# Patient Record
Sex: Male | Born: 1965 | Race: White | Hispanic: No | Marital: Married | Smoking: Current every day smoker
Health system: Southern US, Community
[De-identification: ages and names within clinical notes are randomized; demographics above are authoritative.]

## PROBLEM LIST (undated history)

## (undated) DIAGNOSIS — S022XXA Fracture of nasal bones, initial encounter for closed fracture: Secondary | ICD-10-CM

## (undated) DIAGNOSIS — S0285XA Fracture of orbit, unspecified, initial encounter for closed fracture: Secondary | ICD-10-CM

## (undated) DIAGNOSIS — S02609A Fracture of mandible, unspecified, initial encounter for closed fracture: Secondary | ICD-10-CM

## (undated) DIAGNOSIS — M549 Dorsalgia, unspecified: Secondary | ICD-10-CM

## (undated) DIAGNOSIS — M1990 Unspecified osteoarthritis, unspecified site: Secondary | ICD-10-CM

## (undated) DIAGNOSIS — K219 Gastro-esophageal reflux disease without esophagitis: Secondary | ICD-10-CM

## (undated) DIAGNOSIS — E785 Hyperlipidemia, unspecified: Secondary | ICD-10-CM

## (undated) HISTORY — PX: NO PAST SURGERIES: SHX2092

---
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
## 2008-02-23 ENCOUNTER — Encounter: Payer: Self-pay | Admitting: Cardiology

## 2008-08-06 ENCOUNTER — Encounter: Payer: Self-pay | Admitting: Cardiology

## 2008-11-26 ENCOUNTER — Encounter: Admission: RE | Admit: 2008-11-26 | Discharge: 2008-11-26 | Payer: Self-pay | Admitting: Neurosurgery

## 2008-12-31 ENCOUNTER — Encounter: Payer: Self-pay | Admitting: Cardiology

## 2009-01-14 ENCOUNTER — Encounter: Payer: Self-pay | Admitting: Cardiology

## 2009-02-13 ENCOUNTER — Encounter: Payer: Self-pay | Admitting: Cardiology

## 2009-07-29 ENCOUNTER — Encounter: Payer: Self-pay | Admitting: Cardiology

## 2009-11-08 ENCOUNTER — Encounter: Payer: Self-pay | Admitting: Cardiology

## 2009-11-25 ENCOUNTER — Encounter: Payer: Self-pay | Admitting: Cardiology

## 2009-12-02 ENCOUNTER — Ambulatory Visit: Payer: Self-pay | Admitting: Cardiology

## 2009-12-02 DIAGNOSIS — R072 Precordial pain: Secondary | ICD-10-CM | POA: Insufficient documentation

## 2009-12-02 DIAGNOSIS — F17200 Nicotine dependence, unspecified, uncomplicated: Secondary | ICD-10-CM

## 2009-12-02 DIAGNOSIS — E785 Hyperlipidemia, unspecified: Secondary | ICD-10-CM

## 2009-12-04 ENCOUNTER — Telehealth: Payer: Self-pay | Admitting: Cardiology

## 2009-12-16 ENCOUNTER — Telehealth: Payer: Self-pay | Admitting: Cardiology

## 2009-12-25 ENCOUNTER — Ambulatory Visit: Payer: Self-pay | Admitting: Cardiology

## 2009-12-26 ENCOUNTER — Ambulatory Visit (HOSPITAL_COMMUNITY): Admission: RE | Admit: 2009-12-26 | Discharge: 2009-12-26 | Payer: Self-pay | Admitting: Cardiology

## 2009-12-27 ENCOUNTER — Telehealth: Payer: Self-pay | Admitting: Cardiology

## 2010-01-28 ENCOUNTER — Encounter: Payer: Self-pay | Admitting: Cardiology

## 2010-02-25 ENCOUNTER — Encounter (INDEPENDENT_AMBULATORY_CARE_PROVIDER_SITE_OTHER): Payer: Self-pay | Admitting: *Deleted

## 2010-06-29 ENCOUNTER — Encounter: Payer: Self-pay | Admitting: Cardiology

## 2010-07-08 NOTE — Miscellaneous (Signed)
  Clinical Lists Changes  Observations: Added new observation of CT SCAN: There are no pleural or pericardial effusions identified. No enlarged lymph nodes are present. Minimal dependent atelectasis identified.  The visualized lungs are otherwise clear without evidence of focal airspace disease, consolidation, or pulmonary nodules/masses. No endobronchial lesions are noted. No acute or suspicious bony abnormalities are identified.   Mild fatty infiltration of the liver is identified.   IMPRESSION: Mild fatty infiltration of the liver. (12/26/2009 9:19)      CT Scan  Procedure date:  12/26/2009  Findings:      There are no pleural or pericardial effusions identified. No enlarged lymph nodes are present. Minimal dependent atelectasis identified.  The visualized lungs are otherwise clear without evidence of focal airspace disease, consolidation, or pulmonary nodules/masses. No endobronchial lesions are noted. No acute or suspicious bony abnormalities are identified.   Mild fatty infiltration of the liver is identified.   IMPRESSION: Mild fatty infiltration of the liver.

## 2010-07-08 NOTE — Progress Notes (Signed)
Summary: wants to set up heart scan  Phone Note Call from Patient   Caller: Patient [phone] Reason for Call: Talk to Nurse Summary of Call: wife janet calling re setting up a heart scan-pt complaining of some chest pain and they want the test done asap-their insurance will cover one scan-so they are hoping that's all he needs-pls call [phone] Initial call taken by: Glynda Jaeger,  December 04, 2009 10:08 AM  Follow-up for Phone Call        Spoke with pt's wife regarding cardiac CT. to be scheduled. Wife states the insurance will pay for one scan. Pt's wife call was transfered [place] to  set up the appointment.  Follow-up by: Ollen Gross, RN, BSN,  December 04, 2009 11:16 AM

## 2010-07-08 NOTE — Letter (Signed)
Summary: [facility]  [facility]   Imported By: Marylou Mccoy 03/19/2010 14:17:25  _____________________________________________________________________  External Attachment:    Type:   Image     Comment:   External Document

## 2010-07-08 NOTE — Progress Notes (Signed)
Summary: cp  Phone Note Call from Patient Call back at Home Phone [phone]   Caller: Spouse Reason for Call: Talk to Nurse Details for Reason: cp Summary of Call: Pt called me @ home stating he was having  cp. Initial call taken by: Omar Person,  December 04, 2009 11:17 AM  Follow-up for Phone Call        Spoke with pt. Patient states he has been having  a slight pressure on and off for some time on his chest it lasts 30 to 45 minutes. today when he has the pressure he ate two peanut butter crakers and the pain was relief instantly. Pt. would like to know if that was a coincidense?. I let him know it could be ,but I am not sure. Pt. states he feels fine now. Pt denies any  other symptoms. Pt also was calling regarding the cardiac CT scan. I let py. know the test has been scheduled. His wifer is aware. Okay with pt. Follow-up by: Ollen Gross, RN, BSN,  December 04, 2009 11:35 AM

## 2010-07-08 NOTE — Assessment & Plan Note (Signed)
Summary: np6/ dx: chestpain. normal ekg, + risk factors/ pt has [place] gd   Visit Type:  Initial Consult Primary Provider:  Paulita Cradle  CC:  chest pain.  History of Present Illness: The patient presents for evaluation of chest discomfort. He has no prior cardiac history but significant cardiovascular risk factors. He reports chest discomfort for about 4-5 months. This has been waking him up at night. It is substernal. It is a heaviness. It is moderate in intensity. It lasts for several minutes perhaps up to 30. It comes and goes spontaneously. He has noticed this rarely during the day but cannot bring it on with activity. He does yard work and some lifting without bringing this on. This discomfort is not like his previous reflux. There is no radiation. There are no associated symptoms. Recently he started Xanax and has noticed less of this discomfort at night. He did have an exercise treadmill test in September. I reviewed this. There were no abnormalities suggestive of high-grade obstructive coronary disease at that time.  Current Medications (verified): 1)  Fenofibrate 160 Mg Tabs (Fenofibrate) .Marland Kitchen.. 1 Tab Once Daily 2)  Hydrocodone-Acetaminophen 10-325 Mg Tabs (Hydrocodone-Acetaminophen) .... 2 Tabs Qam..1 Tab Lunch Time..2 Tabs At Bedtime. 3)  Soma 350 Mg Tabs (Carisoprodol) .Marland Kitchen.. 1 Tab Three Times A Day 4)  Alprazolam 1 Mg Tabs (Alprazolam) .... 1/2 Tab Two Times A Day At Bedtime  Allergies (verified): No Known Drug Allergies  Past History:  Past Medical History: Dyslipidemia Tobacco abuse  Family History: Father died of a myocardial infarction at age 45  Social History: The patient is a Production designer, theatre/television/film for [employer] He has 2 sons He is married He has smoked one pack per day all of his adult life  Review of Systems       Positive for back pain, reflux. Otherwise as stated in the history of present illness negative for all other systems.  Vital Signs:  Patient profile:   45 year  old male Height:      73 inches Weight:      229 pounds BMI:     30.32 Pulse rate:   68 / minute Pulse rhythm:   regular BP sitting:   123 / 84  (left arm) Cuff size:   large  Vitals Entered By: Danielle Rankin, CMA (December 02, 2009 1:21 PM)  Nutrition Counseling: Patient's BMI is greater than 25 and therefore counseled on weight management options.  Physical Exam  General:  Well developed, well nourished, in no acute distress. Head:  normocephalic and atraumatic Eyes:  PERRLA/EOM intact; conjunctiva and lids normal. Mouth:  Teeth, gums and palate normal. Oral mucosa normal. Neck:  Neck supple, no JVD. No masses, thyromegaly or abnormal cervical nodes. Chest Wall:  no deformities or breast masses noted Lungs:  Clear bilaterally to auscultation and percussion. Heart:  Non-displaced PMI, chest non-tender; regular rate and rhythm, S1, S2 without murmurs, rubs or gallops. Carotid upstroke normal, no bruit. Normal abdominal aortic size, no bruits. Femorals normal pulses, no bruits. Pedals normal pulses. No edema, no varicosities. Abdomen:  Bowel sounds positive; abdomen soft and non-tender without masses, organomegaly, or hernias noted. No hepatosplenomegaly. Msk:  Back normal, normal gait. Muscle strength and tone normal. Pulses:  pulses normal in all 4 extremities Extremities:  No clubbing or cyanosis. Neurologic:  Alert and oriented x 3. Skin:  Intact without lesions or rashes. Cervical Nodes:  no significant adenopathy Inguinal Nodes:  no significant adenopathy Psych:  Normal affect.   EKG  Procedure date:  12/02/2009  Findings:      Sinus rhythm, rate 68, axis within normal limits, intervals within normal limits, no acute ST-T wave changes.  Impression & Recommendations:  Problem # 1:  PRECORDIAL PAIN (ICD-786.51) The patient has very significant cardiovascular risk factors. I think the possibility of obstructive coronary disease is high. Therefore, imaging is indicated. I think  the best test in this situation would be coronary CT angiography. Orders: Cardiac CTA (Cardiac CTA)  Problem # 2:  TOBACCO ABUSE (ICD-305.1) I spent greater than 10 minutes discussing the need to stop smoking. I offered him the drug Chantix. He does not want to try this now but will consider quitting on his own terms.  Problem # 3:  DYSLIPIDEMIA (ICD-272.4) He has very elevated triglycerides. This is being followed by his primary care doctor. He is to be on Niaspan and fenofibrate but is not affording these medications. I will defer to his primary provider. We discussed at length dietary changes.  Patient Instructions: 1)  Your physician has recommended you make the following change in your medication:  2)  Start Omeprazole 40mg  daily 3)  Your physician has requested that you have a cardiac CT.  Cardiac computed tomography (CT) is a painless test that uses an x-ray machine to take clear, detailed pictures of your heart.  For further information please visit [email or web address]  Please follow instruction sheet as given. 4)  Your physician recommends that you schedule a follow-up appointment in: 2 months with Dr  Antoine Poche. Prescriptions: OMEPRAZOLE 40 MG CPDR (OMEPRAZOLE) one daily  #90 x 0   Entered by:   Katina Dung, RN, BSN   Authorized by:   Rollene Rotunda, MD, [facility]   Signed by:   Katina Dung, RN, BSN on 12/02/2009   Method used:   Electronically to        [facility] [phone]* (retail)       [address]       [place], [place]  [address]       Ph: [phone] or [phone]       Fax: [phone]   RxID:   [id number]   I have reviewed and approved all prescriptions at the time of this visit. Rollene Rotunda, MD, [facility]  December 02, 2009 2:20 PM

## 2010-07-08 NOTE — Progress Notes (Signed)
Summary: Cardiac CT  Phone Note Call from Patient Call back at Home Phone [phone]   Caller: Spouse Reason for Call: Talk to Nurse Summary of Call: pt forgot about Cardiac CT appt would like to have it rs.  Initial call taken by: Edman Circle,  December 16, 2009 2:32 PM  Follow-up for Phone Call        12/16/09--1440pm--pt's wife calling stating pt forgot about scheduled  cardiac CT--advised would have to reschedule--pt's wife states they do want to reschedule--information given to [place] for rescheduling--nt Follow-up by: Ledon Snare, RN,  December 16, 2009 2:59 PM

## 2010-07-08 NOTE — Letter (Signed)
Summary: Appointment - Missed  [facility], Main Office  [address]   [place], [place] [address]   Phone: [phone]  Fax: [phone]     February 25, 2010 MRN: [id number]   Timothy Villa [address] [place], [place]  [address]   Dear Mr. Shealy,  Our records indicate you missed your appointment on 01/29/2010 at 04:30pm with Dr. Antoine Poche. It is very important that we reach you to reschedule this appointment. We look forward to participating in your health care needs. Please contact us at the number listed above at your earliest convenience to reschedule this appointment.     Sincerely,  Neurosurgeon Team  GD

## 2010-07-08 NOTE — Progress Notes (Signed)
  Phone Note Other Incoming   Caller: PT. Summary of Call: PT CALLED CONT TO C/O CHEST HEAVINESS  BELIEVES  STRESS RELATED  WAS WANTING DR Haiden Clucas TO PERSCRIBE SOMETHING FOR STRESS PER PT GEN XANAX NOT WORKING INSTRUCTED PT TO CALL PMD FOR DIFFER MED  Initial call taken by: Scherrie Bateman, LPN,  December 27, 2009 4:13 PM

## 2012-12-06 ENCOUNTER — Encounter (HOSPITAL_COMMUNITY): Payer: Self-pay | Admitting: General Practice

## 2012-12-06 ENCOUNTER — Observation Stay (HOSPITAL_COMMUNITY)
Admission: AD | Admit: 2012-12-06 | Discharge: 2012-12-07 | Disposition: A | Discharge status: Home / Self Care | Payer: BC Managed Care – PPO | Source: Other Acute Inpatient Hospital | Attending: General Surgery | Admitting: General Surgery

## 2012-12-06 DIAGNOSIS — W208XXA Other cause of strike by thrown, projected or falling object, initial encounter: Secondary | ICD-10-CM | POA: Insufficient documentation

## 2012-12-06 DIAGNOSIS — S1093XA Contusion of unspecified part of neck, initial encounter: Secondary | ICD-10-CM | POA: Insufficient documentation

## 2012-12-06 DIAGNOSIS — S02401A Maxillary fracture, unspecified, initial encounter for closed fracture: Secondary | ICD-10-CM

## 2012-12-06 DIAGNOSIS — S02109A Fracture of base of skull, unspecified side, initial encounter for closed fracture: Secondary | ICD-10-CM

## 2012-12-06 DIAGNOSIS — S0003XA Contusion of scalp, initial encounter: Secondary | ICD-10-CM | POA: Insufficient documentation

## 2012-12-06 DIAGNOSIS — S022XXA Fracture of nasal bones, initial encounter for closed fracture: Secondary | ICD-10-CM

## 2012-12-06 DIAGNOSIS — R200 Anesthesia of skin: Secondary | ICD-10-CM | POA: Diagnosis present

## 2012-12-06 DIAGNOSIS — S0285XA Fracture of orbit, unspecified, initial encounter for closed fracture: Secondary | ICD-10-CM

## 2012-12-06 DIAGNOSIS — S02609A Fracture of mandible, unspecified, initial encounter for closed fracture: Secondary | ICD-10-CM

## 2012-12-06 DIAGNOSIS — Y93H9 Activity, other involving exterior property and land maintenance, building and construction: Secondary | ICD-10-CM | POA: Insufficient documentation

## 2012-12-06 DIAGNOSIS — G5690 Unspecified mononeuropathy of unspecified upper limb: Secondary | ICD-10-CM | POA: Insufficient documentation

## 2012-12-06 DIAGNOSIS — R209 Unspecified disturbances of skin sensation: Secondary | ICD-10-CM

## 2012-12-06 DIAGNOSIS — S0993XA Unspecified injury of face, initial encounter: Secondary | ICD-10-CM

## 2012-12-06 DIAGNOSIS — S0230XA Fracture of orbital floor, unspecified side, initial encounter for closed fracture: Secondary | ICD-10-CM | POA: Insufficient documentation

## 2012-12-06 DIAGNOSIS — S0280XA Fracture of other specified skull and facial bones, unspecified side, initial encounter for closed fracture: Secondary | ICD-10-CM

## 2012-12-06 HISTORY — DX: Fracture of mandible, unspecified, initial encounter for closed fracture: S02.609A

## 2012-12-06 HISTORY — DX: Fracture of nasal bones, initial encounter for closed fracture: S02.2XXA

## 2012-12-06 HISTORY — DX: Gastro-esophageal reflux disease without esophagitis: K21.9

## 2012-12-06 HISTORY — DX: Hyperlipidemia, unspecified: E78.5

## 2012-12-06 HISTORY — DX: Fracture of orbit, unspecified, initial encounter for closed fracture: S02.85XA

## 2012-12-06 HISTORY — DX: Unspecified osteoarthritis, unspecified site: M19.90

## 2012-12-06 HISTORY — DX: Dorsalgia, unspecified: M54.9

## 2012-12-06 MED ORDER — SODIUM CHLORIDE 0.9 % IV SOLN: 250.0000 mL | INTRAVENOUS | Status: DC | PRN

## 2012-12-06 MED ORDER — ENOXAPARIN SODIUM 30 MG/0.3ML ~~LOC~~ SOLN
30.0000 mg | Freq: Two times a day (BID) | SUBCUTANEOUS | Status: DC
  Administered 2012-12-06: 30 mg via SUBCUTANEOUS
  Filled 2012-12-06 (×4): qty 0.3

## 2012-12-06 MED ORDER — PANTOPRAZOLE SODIUM 40 MG PO TBEC
40.0000 mg | DELAYED_RELEASE_TABLET | Freq: Every day | ORAL | Status: DC
  Administered 2012-12-06: 40 mg via ORAL
  Filled 2012-12-06: qty 1

## 2012-12-06 MED ORDER — DOCUSATE SODIUM 100 MG PO CAPS
100.0000 mg | ORAL_CAPSULE | Freq: Two times a day (BID) | ORAL | Status: DC
  Administered 2012-12-07: 100 mg via ORAL
  Filled 2012-12-06 (×2): qty 1

## 2012-12-06 MED ORDER — NICOTINE 21 MG/24HR TD PT24
21.0000 mg | MEDICATED_PATCH | Freq: Every day | TRANSDERMAL | Status: DC
  Administered 2012-12-06 – 2012-12-07 (×2): 21 mg via TRANSDERMAL
  Filled 2012-12-06 (×2): qty 1

## 2012-12-06 MED ORDER — ONDANSETRON HCL 4 MG PO TABS: 4.0000 mg | ORAL_TABLET | Freq: Four times a day (QID) | ORAL | Status: DC | PRN

## 2012-12-06 MED ORDER — OXYCODONE HCL 5 MG PO TABS
10.0000 mg | ORAL_TABLET | ORAL | Status: DC | PRN
  Administered 2012-12-06 – 2012-12-07 (×6): 20 mg via ORAL
  Filled 2012-12-06 (×7): qty 4

## 2012-12-06 MED ORDER — OXYMETAZOLINE HCL 0.05 % NA SOLN
2.0000 | Freq: Four times a day (QID) | NASAL | Status: DC
  Administered 2012-12-06: 2 via NASAL
  Filled 2012-12-06 (×2): qty 15

## 2012-12-06 MED ORDER — BACITRACIN ZINC 500 UNIT/GM EX OINT
TOPICAL_OINTMENT | Freq: Two times a day (BID) | CUTANEOUS | Status: DC
  Administered 2012-12-06: 1 via TOPICAL
  Administered 2012-12-07: 11:00:00 via TOPICAL
  Filled 2012-12-06: qty 15

## 2012-12-06 MED ORDER — MORPHINE SULFATE 4 MG/ML IJ SOLN
INTRAMUSCULAR | Status: AC
  Administered 2012-12-06: 4 mg
  Filled 2012-12-06: qty 1

## 2012-12-06 MED ORDER — SODIUM CHLORIDE 0.9 % IJ SOLN: 3.0000 mL | Freq: Two times a day (BID) | INTRAMUSCULAR | Status: DC

## 2012-12-06 MED ORDER — MORPHINE SULFATE 4 MG/ML IJ SOLN
4.0000 mg | INTRAMUSCULAR | Status: DC | PRN
  Administered 2012-12-06 – 2012-12-07 (×4): 4 mg via INTRAVENOUS
  Filled 2012-12-06 (×5): qty 1

## 2012-12-06 MED ORDER — ONDANSETRON HCL 4 MG/2ML IJ SOLN: 4.0000 mg | Freq: Four times a day (QID) | INTRAMUSCULAR | Status: DC | PRN

## 2012-12-06 MED ORDER — PANTOPRAZOLE SODIUM 40 MG IV SOLR
40.0000 mg | Freq: Every day | INTRAVENOUS | Status: DC
  Filled 2012-12-06: qty 40

## 2012-12-06 MED ORDER — SODIUM CHLORIDE 0.9 % IJ SOLN: 3.0000 mL | INTRAMUSCULAR | Status: DC | PRN

## 2012-12-06 NOTE — Consult Note (Signed)
Reason for Consult:l facial trauma  Referring Physician: Trauma  Timothy Villa is an 47 y.o. male.  WUJ:WJXBJ force trauma to the left face while cutting down a tree,No LOC. Seen at ED at [facility] [place]. With facial fractures and referred to [facility]. Pt denies loss of vision. Denies diplopia.  Past Medical History  Diagnosis Date  . Fracture, orbital 12/06/2012    LEFT EYE  . Nasal fracture 12/06/2012  . Jaw fracture 12/06/2012  . GERD (gastroesophageal reflux disease)   . Back pain   . Hyperlipidemia   . Arthritis     History reviewed. No pertinent past surgical history.  History reviewed. No pertinent family history.  Social History:  reports that he has been smoking Cigarettes.  He has a 30 pack-year smoking history. He has never used smokeless tobacco. He reports that  drinks alcohol. He reports that he does not use illicit drugs.  Allergies: No Known Allergies  Medications: I have reviewed the patient's current medications.  No results found for this or any previous visit (from the past 48 hour(s)).  No results found.  Review of Systems  Constitutional: Negative.   HENT: Negative for hearing loss, ear pain, nosebleeds, congestion, sore throat, tinnitus and ear discharge.   Eyes: Negative for blurred vision, double vision, photophobia, pain, discharge and redness.  Respiratory: Negative.  Negative for stridor.   Cardiovascular: Negative.   Gastrointestinal: Negative.   Genitourinary: Negative.   Musculoskeletal: Negative.   Skin: Negative.   Neurological: Positive for headaches.   Blood pressure 132/81, pulse 72, temperature 98.5 F (36.9 C), temperature source Axillary, resp. rate 16, SpO2 97.00%. Physical Exam  Constitutional: He appears well-developed and well-nourished.  HENT:  Head: Normocephalic. Head is with abrasion, with contusion and with left periorbital erythema.    Eyes: EOM are normal. Pupils are equal, round, and reactive to light.  Fundoscopic  exam:      The right eye shows no hemorrhage. The right eye shows red reflex.       The left eye shows no hemorrhage. The left eye shows red reflex.  Slit lamp exam:      The right eye shows no corneal abrasion, no corneal flare, no corneal ulcer, no foreign body, no hyphema and no hypopyon.       The left eye shows no corneal abrasion, no corneal flare, no corneal ulcer, no foreign body, no hyphema and no hypopyon.    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 Atraumatic left globe status post blunt force trauma to face resulting in facial fractures of the orbital floor and nose. There is no evidence of golobe or optic nerve trauma or impingement.  The patient is clear for facial reconstruction per plastics.  Additional eye care is advised as an outpatient 1-2 weeks s/p fracture repair.  Timothy Villa B 12/06/2012, 6:29 PM

## 2012-12-06 NOTE — Consult Note (Signed)
Reason for Consult:Facial fracture Referring Physician: Dr. Burke Thompson  Timothy Villa is an 46 y.o. male.  HPI: The patient is a 46 yrs old wm here with his wife for evaluation of facial fractures and right hand ulnar sided numbness.  He was cutting down a tree when a portion of it broke and he was struck in the face.  He was taken to the [facility] ED.  CT shows fracture of the left orbital floor, inferior rim, medial wall, maxillary anterior wall and bilateral displaced nasal fractures.  Clinically, he does not have a hematoma of the septum but he is bleeding from his nose.  He has significant swelling of the left periorbital area. EOMI bilaterally and no double vision.  Bone step offs of the left, no malocclusion or jaw pain.   Past Medical History  Diagnosis Date  . Fracture, orbital 12/06/2012    LEFT EYE  . Nasal fracture 12/06/2012  . Jaw fracture 12/06/2012  . GERD (gastroesophageal reflux disease)   . Back pain   . Hyperlipidemia   . Arthritis     History reviewed. No pertinent past surgical history.  History reviewed. No pertinent family history.  Social History:  reports that he has been smoking Cigarettes.  He has a 30 pack-year smoking history. He has never used smokeless tobacco. He reports that  drinks alcohol. He reports that he does not use illicit drugs.  Allergies: No Known Allergies  Medications: I have reviewed the patient's current medications.  No results found for this or any previous visit (from the past 48 hour(s)).  No results found.  Review of Systems  Constitutional: Negative.   HENT: Positive for nosebleeds.   Eyes: Positive for pain, discharge and redness. Negative for blurred vision and double vision.  Respiratory: Negative.   Cardiovascular: Negative.  Negative for chest pain.  Gastrointestinal: Negative.  Negative for nausea and vomiting.  Genitourinary: Negative.   Musculoskeletal: Negative.   Skin: Negative.   Neurological: Positive for  tingling and headaches. Negative for speech change and seizures.  Psychiatric/Behavioral: Negative.    Blood pressure 132/81, pulse 72, temperature 98.5 F (36.9 C), temperature source Axillary, resp. rate 16, SpO2 97.00%. Physical Exam  Constitutional: He appears well-developed and well-nourished.  HENT:  Head: Normocephalic.    Right Ear: External ear normal.  Left Ear: External ear normal.  Eyes: EOM are normal. Pupils are equal, round, and reactive to light. Scleral icterus is present.  Cardiovascular: Normal rate.   Respiratory: Effort normal.  Neurological: He is alert.  Skin: Skin is warm.  Psychiatric: He has a normal mood and affect. His behavior is normal. Judgment and thought content normal.    Assessment/Plan: Bilateral nasal fractures, left orbital fracture.  HOB elevated 45 degrees, no nasal blowing, afrin spray to nose. Ice on face as tolerated.  Follow up in clinic.  Will need surgery for nose and orbit.  SANGER,CLAIRE 12/06/2012, 6:17 PM      

## 2012-12-06 NOTE — H&P (Signed)
Timothy Villa is an 47 y.o. male.   Chief Complaint: facialpain, right third, fourth, and fifth finger numbness HPI: Patient was working at cutting down a tree when the tree split. A large portion of the trunk him in the left side of his face. No loss of consciousness. He was evaluated at the emergency department at [facility] in [place]. He was found to have multiple facial fractures. He also complained of numbness in his right third fourth and fifth finger. Emergency department physician there contacted Dr. Kelly Splinter from plastic surgery regarding his facial fractures. He was concerned regarding the finger numbness and he asked [place] to admit the patient to our trauma service. I accepted him in transfer.On arrival he complains of headache and finger numbness as above.  Past medical history: Arthritis, back pain, reflux  Past surgical history: Denies  No family history on file. Social History:  has no tobacco, alcohol, and drug history on file.  Allergies: None  Meds: Nexium, hydrocodone  No results found for this or any previous visit (from the past 48 hour(s)). No results found.  Review of Systems  Constitutional: Negative.   HENT: Positive for ear pain.        Ear discomfort since swimming yesterday  Eyes: Positive for pain. Negative for blurred vision and double vision.  Respiratory: Negative.   Cardiovascular: Negative.   Gastrointestinal: Negative.   Genitourinary: Negative.   Musculoskeletal: Negative.   Skin: Negative.   Neurological: Positive for tingling and headaches.  Endo/Heme/Allergies: Negative.   Psychiatric/Behavioral: Negative.     Heart rate 68, respirations 16, blood pressure and temperature pending Physical Exam  Constitutional: He appears well-developed and well-nourished. No distress.  HENT:  Head: Head is with contusion, with laceration and with left periorbital erythema.    Right Ear: No drainage. Tympanic membrane is not injected. No hemotympanum.  Left Ear:  No drainage. Tympanic membrane is not injected. No hemotympanum.  Nose: Sinus tenderness and nasal deformity present. Epistaxis is observed.  Significant edema and tenderness bridge of nose, left periorbital ecchymosis and small abrasion type lacerations to the upper lid and just lateral to the eye, facial tissue is packed into each nare  Eyes: EOM are normal. Pupils are equal, round, and reactive to light. No scleral icterus.  Neck: Normal range of motion. No tracheal deviation present.  No posterior midline tenderness, no pain on active range of motion  Cardiovascular: Normal rate, regular rhythm, normal heart sounds and intact distal pulses.   Respiratory: Effort normal. No stridor. No respiratory distress. He has no wheezes. He has no rales. He exhibits no tenderness.  GI: Soft. Bowel sounds are normal. He exhibits no distension and no mass. There is no tenderness. There is no rebound and no guarding.  Musculoskeletal: Normal range of motion. He exhibits no edema and no tenderness.  Neurological: He is alert. He displays no atrophy and no tremor. He exhibits normal muscle tone. He displays no seizure activity. GCS eye subscore is 4. GCS verbal subscore is 5. GCS motor subscore is 6.  Strength equal x4 extremities Decreased light touch sensation right third, fourth, and fifth finger  Skin: Skin is warm.  Tattoo left arm     Assessment/Plan Status post blunt facial trauma from a tree packed. Facial fractures including nasal fracture, left orbital floor fracture, left maxillary sinus fracture and facial contusions and abrasions, numbness of right third, fourth, and fifth finger. Admit to trauma. Dr. Kelly Splinter will see him in consultation from plastic surgery for  the facial injuries. We will also ask Dr. Allyne Gee from ophthalmology to see him regarding this injury. We do not have a good explanation for the right finger numbness. We will check an MR of the cervical spine to rule out an acute disc  injury. Plan was discussed in detail with the patient and his wife.  Jerriah Ines E 12/06/2012, 4:02 PM

## 2012-12-07 ENCOUNTER — Observation Stay (HOSPITAL_COMMUNITY): Payer: BC Managed Care – PPO

## 2012-12-07 ENCOUNTER — Encounter (HOSPITAL_COMMUNITY): Payer: Self-pay

## 2012-12-07 DIAGNOSIS — G5690 Unspecified mononeuropathy of unspecified upper limb: Secondary | ICD-10-CM

## 2012-12-07 DIAGNOSIS — S0993XA Unspecified injury of face, initial encounter: Secondary | ICD-10-CM

## 2012-12-07 MED ORDER — IOHEXOL 350 MG/ML SOLN
50.0000 mL | Freq: Once | INTRAVENOUS | Status: AC | PRN
  Administered 2012-12-07: 50 mL via INTRAVENOUS

## 2012-12-07 MED ORDER — NAPROXEN 500 MG PO TABS: 500.0000 mg | ORAL_TABLET | Freq: Two times a day (BID) | ORAL | Status: AC

## 2012-12-07 MED ORDER — NAPROXEN 500 MG PO TABS
500.0000 mg | ORAL_TABLET | Freq: Two times a day (BID) | ORAL | Status: DC
  Filled 2012-12-07 (×2): qty 1

## 2012-12-07 MED ORDER — OXYMETAZOLINE HCL 0.05 % NA SOLN: 2.0000 | Freq: Two times a day (BID) | NASAL | Status: AC

## 2012-12-07 MED ORDER — TRAMADOL HCL 50 MG PO TABS
100.0000 mg | ORAL_TABLET | Freq: Four times a day (QID) | ORAL | Status: DC
  Administered 2012-12-07: 100 mg via ORAL
  Filled 2012-12-07: qty 2

## 2012-12-07 MED ORDER — OXYCODONE-ACETAMINOPHEN 10-325 MG PO TABS: 1.0000 | ORAL_TABLET | ORAL | Status: AC | PRN

## 2012-12-07 MED ORDER — TRAMADOL HCL 50 MG PO TABS: 100.0000 mg | ORAL_TABLET | Freq: Four times a day (QID) | ORAL | Status: AC

## 2012-12-07 MED ORDER — CARISOPRODOL 350 MG PO TABS
350.0000 mg | ORAL_TABLET | Freq: Four times a day (QID) | ORAL | Status: DC
  Administered 2012-12-07 (×2): 350 mg via ORAL
  Filled 2012-12-07 (×2): qty 1

## 2012-12-07 NOTE — Progress Notes (Signed)
Patient ID: Timothy Villa, male   DOB: May 10, 1966, 47 y.o.   MRN: [id number]   LOS: 1 day   Subjective: C/o severe pain in face and neck, oxycodone not strong enough. Thinks numbness in right fingers worse, had some brief pain last night.   Objective: Vital signs in last 24 hours: Temp:  [98.1 F (36.7 C)-98.8 F (37.1 C)] 98.8 F (37.1 C) (07/02 0534) Pulse Rate:  [57-72] 60 (07/02 0534) Resp:  [16-19] 17 (07/02 0534) BP: (107-138)/(63-81) 117/71 mmHg (07/02 0534) SpO2:  [97 %-99 %] 98 % (07/02 0534) Last BM Date: 12/06/12   Physical Exam General appearance: alert and no distress Back: TTP right trap w/spasm Resp: clear to auscultation bilaterally Cardio: regular rate and rhythm GI: normal findings: bowel sounds normal and soft, non-tender Extremities: Warm and dry   Assessment/Plan: Blunt facial trauma Multiple facial fxs/lacs -- Dr. Kelly Splinter plans delayed ORIF Right hand neuropathy -- Awaiting MRI FEN -- Pain not well controlled as still needing breakthrough IV meds, will add tramadol, NSAID, MR VTE -- SCD's, Lovenox Dispo -- Home once pain controlled if MRI negative   Freeman Caldron, PA-C Pager: [phone] General Trauma PA Pager: [phone]   12/07/2012

## 2012-12-07 NOTE — Progress Notes (Signed)
Patient is ambulating in the hallways.  May be able to go home later this afternoon.  This patient has been seen and I agree with the findings and treatment plan.  Marta Lamas. Gae Bon, MD, FACS [phone] (pager) [phone] (direct pager) Trauma Surgeon

## 2012-12-07 NOTE — Progress Notes (Signed)
UR completed 

## 2012-12-07 NOTE — Progress Notes (Signed)
Discharge instructions reviewed with patient, including new prescriptions. Printed AVS and prescription given to patient. Patient discharged to home. Accompanied by family.

## 2012-12-07 NOTE — Progress Notes (Signed)
Pt c/o increased bleeding from left nostril. Blood is dark and thick and is a slow continuous dripping. Had a dose of Afrin nasal spray about 1915 and pt stated it didn't seem to help. MD on call, Dwain Sarna called for further advice. [place] recommended I call Dr. Kelly Splinter who had seen him previously. I called Dr. Kelly Splinter who suggested Vaseline Gauze to be packed into the left nare and a drip pad put in place and taped. Patient made aware and agreed. Vaseline gauze packed into left nare. Pt refused drip pad. Will continue to monitor.

## 2012-12-08 NOTE — Clinical Social Work Note (Signed)
Clinical Social Work Department BRIEF PSYCHOSOCIAL ASSESSMENT 12/07/2012  Patient:  BREYLON, SHERROW     Account Number:  [id number]     Admit date:  12/06/2012  Clinical Social Worker:  Verl Blalock  Date/Time:  12/07/2012 04:00 PM  Referred by:  Physician  Date Referred:  12/07/2012 Referred for  Psychosocial assessment   Other Referral:   Interview type:  Patient Other interview type:   Patient mother and wife at bedside    PSYCHOSOCIAL DATA Living Status:  WIFE Admitted from facility:   Level of care:   Primary support name:  Benak,Janet  [phone] Primary support relationship to patient:  SPOUSE Degree of support available:   Strong    CURRENT CONCERNS Current Concerns  None Noted   Other Concerns:    SOCIAL WORK ASSESSMENT / PLAN Clinical Social Worker met with patient and family at bedside to offer support and discuss patient needs at discharge.  Patient states that he was cutting down a tree in his yard with a logging company when the tree split and came back hitting him in the face.  Patient never lost conciousness and feels very lucky knowing the size of the tree.  Patient wife brought him to [facility] but due to severe facial fractures, he was transferred to [facility]. Patient lives at home with his wife and plans to return home at discharge.  Patient mother has come to stay for several days to assist as well.  Patient is hopeful to discharge this afternoon and has adequate transportation for discharge.    Clinical Social Worker inquired about current substance use.  Patient states that he did a lot of drinking while he was with the [employer], but now he is just a social drinker. Patient has no concerns regarding current use and no substances were involved at the time of the incident. SBIRT complete with no resources needed at this time.  CSW signing off at this time.  No further needs identified. Please reconsult if further needs arise prior to discharge.    Assessment/plan status:  No Further Intervention Required Other assessment/ plan:   Information/referral to community resources:   Patient declined all resources at this time.    PATIENT'S/FAMILY'S RESPONSE TO PLAN OF CARE: Patient alert and oriented x3 sitting up in bed.  Patient is very anxious to get back home.  Patient with great family support who are able to assist when patient gets back home.  Patient and family verbalized their appreciation for CSW support and concern.

## 2012-12-12 ENCOUNTER — Telehealth (HOSPITAL_COMMUNITY): Payer: Self-pay | Admitting: Emergency Medicine

## 2012-12-12 NOTE — Telephone Encounter (Signed)
Faxed note to patient's work clearing him to return if they are ok with his narcotic use. Tried to call patient but no answer and unable to leave message.

## 2012-12-14 NOTE — Discharge Summary (Signed)
Physician Discharge Summary  Patient ID: SANTONIO SPEAKMAN MRN: [id number] DOB/AGE: March 30, 1966 47 y.o.  Admit date: 12/06/2012 Discharge date: 12/07/2012  Discharge Diagnoses Patient Active Problem List   Diagnosis Date Noted  . Blunt trauma of face 12/07/2012  . Left orbit fracture 12/06/2012  . Closed fracture nasal bone 12/06/2012  . Maxillary sinus fracture 12/06/2012  . Numbness of fingers 12/06/2012  . DYSLIPIDEMIA 12/02/2009  . TOBACCO ABUSE 12/02/2009  . PRECORDIAL PAIN 12/02/2009    Consultants Dr. Wayland Denis for plastic surgery  Dr. Stephannie Li for ophthalmology   Procedures None   HPI: Benford was working at cutting down a tree when the tree split. A large portion of the trunk him in the left side of his face. There was no loss of consciousness. He was evaluated at the emergency department at [facility] in [place]. He was found to have multiple facial fractures. He also complained of numbness in his right third fourth and fifth finger. The emergency department physician there contacted Dr. Kelly Splinter from plastic surgery regarding his facial fractures. He was concerned regarding the finger numbness and he asked [place] to admit the patient to our trauma service. I accepted him in transfer.   Hospital Course: The patient was evaluated by plastic surgery who determined he needed delayed fixation of his facial fractures. Ophthalmology cleared him for surgery from their standpoint and advised outpatient followup. His CT scans from [facility] were reviewed. The CT of the cervical spine did not show any fractures but did show significant degenerative changes. Because of the new right-hand neurologic symptoms an MRI was obtained. The showed significant disc disease with some impingement but more importantly appeared to show no flow in the right vertebral artery. 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 At some  point around this time radiology was able to access the patient's previous radiologic studies from [facility] and found that the MRI was unchanged therefore the findings were not acute. His pain was brought under control with oral medications. He was ambulating well and was able to be discharged home in good condition in the care of his wife with recommended close followup with his neurosurgeon Dr. Trey Sailors.      Medication List    STOP taking these medications       HYDROcodone-acetaminophen 10-325 MG per tablet  Commonly known as:  NORCO      TAKE these medications       carisoprodol 350 MG tablet  Commonly known as:  SOMA  Take 350 mg by mouth 3 (three) times daily as needed for muscle spasms.     esomeprazole 40 MG capsule  Commonly known as:  NEXIUM  Take 40 mg by mouth daily before breakfast.     naproxen 500 MG tablet  Commonly known as:  NAPROSYN  Take 1 tablet (500 mg total) by mouth 2 (two) times daily with a meal.     oxyCODONE-acetaminophen 10-325 MG per tablet  Commonly known as:  PERCOCET  Take 1-2 tablets by mouth every 4 (four) hours as needed for pain.     oxymetazoline 0.05 % nasal spray  Commonly known as:  AFRIN  Place 2 sprays into the nose 2 (two) times daily.     simvastatin 40 MG tablet  Commonly known as:  ZOCOR  Take 40 mg by mouth every evening.     traMADol 50 MG tablet  Commonly known as:  ULTRAM  Take 2 tablets (100 mg total) by mouth every 6 (six) hours.             Follow-up Information   Schedule an appointment as soon as possible for a visit with SANGER,CLAIRE, DO.   Contact information:   [address] [place] [address] [phone]       Schedule an appointment as soon as possible for a visit with Trey Sailors, MD.   Contact information:   [address] [place] [address] [phone]       Call [facility]. (As needed)    Contact information:   [address] [place]  [address] [phone]       Signed: Freeman Caldron, PA-C Pager: [phone] General Trauma PA Pager: [phone]  12/14/2012, 3:30 PM

## 2012-12-21 ENCOUNTER — Encounter (HOSPITAL_BASED_OUTPATIENT_CLINIC_OR_DEPARTMENT_OTHER): Payer: Self-pay | Admitting: *Deleted

## 2012-12-21 NOTE — Progress Notes (Signed)
Pt was injured cutting down a tree- Only resp problems from fx nose and face

## 2012-12-22 ENCOUNTER — Ambulatory Visit (HOSPITAL_BASED_OUTPATIENT_CLINIC_OR_DEPARTMENT_OTHER)
Admission: RE | Admit: 2012-12-22 | Discharge: 2012-12-22 | Disposition: A | Discharge status: Home / Self Care | Payer: BC Managed Care – PPO | Source: Ambulatory Visit | Attending: Plastic Surgery | Admitting: Plastic Surgery

## 2012-12-22 ENCOUNTER — Encounter (HOSPITAL_BASED_OUTPATIENT_CLINIC_OR_DEPARTMENT_OTHER): Payer: Self-pay

## 2012-12-22 ENCOUNTER — Encounter (HOSPITAL_BASED_OUTPATIENT_CLINIC_OR_DEPARTMENT_OTHER)
Admission: RE | Disposition: A | Discharge status: Home / Self Care | Payer: Self-pay | Source: Ambulatory Visit | Attending: Plastic Surgery

## 2012-12-22 ENCOUNTER — Encounter (HOSPITAL_BASED_OUTPATIENT_CLINIC_OR_DEPARTMENT_OTHER): Payer: Self-pay | Admitting: Anesthesiology

## 2012-12-22 ENCOUNTER — Ambulatory Visit (HOSPITAL_BASED_OUTPATIENT_CLINIC_OR_DEPARTMENT_OTHER): Payer: BC Managed Care – PPO | Admitting: Anesthesiology

## 2012-12-22 ENCOUNTER — Ambulatory Visit (HOSPITAL_BASED_OUTPATIENT_CLINIC_OR_DEPARTMENT_OTHER): Admit: 2012-12-22 | Payer: Self-pay | Admitting: Plastic Surgery

## 2012-12-22 DIAGNOSIS — E785 Hyperlipidemia, unspecified: Secondary | ICD-10-CM | POA: Insufficient documentation

## 2012-12-22 DIAGNOSIS — S0280XA Fracture of other specified skull and facial bones, unspecified side, initial encounter for closed fracture: Secondary | ICD-10-CM | POA: Insufficient documentation

## 2012-12-22 DIAGNOSIS — M129 Arthropathy, unspecified: Secondary | ICD-10-CM | POA: Insufficient documentation

## 2012-12-22 DIAGNOSIS — S0230XA Fracture of orbital floor, unspecified side, initial encounter for closed fracture: Secondary | ICD-10-CM | POA: Insufficient documentation

## 2012-12-22 DIAGNOSIS — S022XXA Fracture of nasal bones, initial encounter for closed fracture: Secondary | ICD-10-CM | POA: Insufficient documentation

## 2012-12-22 DIAGNOSIS — K219 Gastro-esophageal reflux disease without esophagitis: Secondary | ICD-10-CM | POA: Insufficient documentation

## 2012-12-22 DIAGNOSIS — W208XXA Other cause of strike by thrown, projected or falling object, initial encounter: Secondary | ICD-10-CM | POA: Insufficient documentation

## 2012-12-22 DIAGNOSIS — Y93H9 Activity, other involving exterior property and land maintenance, building and construction: Secondary | ICD-10-CM | POA: Insufficient documentation

## 2012-12-22 HISTORY — PX: CLOSED REDUCTION NASAL FRACTURE: SHX5365

## 2012-12-22 HISTORY — PX: ORIF ORBITAL FRACTURE: SHX5312

## 2012-12-22 LAB — POCT HEMOGLOBIN-HEMACUE: Hemoglobin: 15.6 g/dL (ref 13.0–17.0)

## 2012-12-22 SURGERY — CLOSED REDUCTION, FRACTURE, NASAL BONE
Anesthesia: General | Laterality: Right

## 2012-12-22 SURGERY — CLOSED REDUCTION, FRACTURE, NASAL BONE
Anesthesia: General | Site: Nose | Wound class: Clean Contaminated

## 2012-12-22 MED ORDER — OXYCODONE HCL 5 MG/5ML PO SOLN: 5.0000 mg | Freq: Once | ORAL | Status: AC | PRN

## 2012-12-22 MED ORDER — BSS IO SOLN
INTRAOCULAR | Status: DC | PRN
  Administered 2012-12-22: 5 mL via INTRAOCULAR

## 2012-12-22 MED ORDER — ONDANSETRON HCL 4 MG/2ML IJ SOLN
INTRAMUSCULAR | Status: DC | PRN
  Administered 2012-12-22: 4 mg via INTRAVENOUS

## 2012-12-22 MED ORDER — LIDOCAINE-EPINEPHRINE 1 %-1:100000 IJ SOLN
INTRAMUSCULAR | Status: DC | PRN
  Administered 2012-12-22: 2 mL

## 2012-12-22 MED ORDER — SUCCINYLCHOLINE CHLORIDE 20 MG/ML IJ SOLN
INTRAMUSCULAR | Status: DC | PRN
  Administered 2012-12-22: 100 mg via INTRAVENOUS

## 2012-12-22 MED ORDER — BACITRACIN-NEOMYCIN-POLYMYXIN 400-5-5000 EX OINT
TOPICAL_OINTMENT | CUTANEOUS | Status: DC | PRN
  Administered 2012-12-22: 1 via TOPICAL

## 2012-12-22 MED ORDER — BACITRACIN-POLYMYXIN B 500-10000 UNIT/GM OP OINT
TOPICAL_OINTMENT | OPHTHALMIC | Status: DC | PRN
  Administered 2012-12-22: 1 via OPHTHALMIC

## 2012-12-22 MED ORDER — FENTANYL CITRATE 0.05 MG/ML IJ SOLN
INTRAMUSCULAR | Status: DC | PRN
  Administered 2012-12-22 (×3): 25 ug via INTRAVENOUS
  Administered 2012-12-22: 100 ug via INTRAVENOUS
  Administered 2012-12-22 (×2): 25 ug via INTRAVENOUS

## 2012-12-22 MED ORDER — OXYMETAZOLINE HCL 0.05 % NA SOLN
NASAL | Status: DC | PRN
  Administered 2012-12-22: 1 via NASAL

## 2012-12-22 MED ORDER — HYDROMORPHONE HCL PF 1 MG/ML IJ SOLN
0.2500 mg | INTRAMUSCULAR | Status: DC | PRN
  Administered 2012-12-22 (×3): 0.5 mg via INTRAVENOUS

## 2012-12-22 MED ORDER — DEXAMETHASONE SODIUM PHOSPHATE 4 MG/ML IJ SOLN
INTRAMUSCULAR | Status: DC | PRN
  Administered 2012-12-22: 8 mg via INTRAVENOUS

## 2012-12-22 MED ORDER — OXYCODONE HCL 5 MG PO TABS
5.0000 mg | ORAL_TABLET | Freq: Once | ORAL | Status: AC | PRN
Start: 2012-12-22 — End: 2012-12-22
  Administered 2012-12-22: 5 mg via ORAL

## 2012-12-22 MED ORDER — PROMETHAZINE HCL 25 MG/ML IJ SOLN: 6.2500 mg | INTRAMUSCULAR | Status: DC | PRN

## 2012-12-22 MED ORDER — PROPOFOL 10 MG/ML IV BOLUS
INTRAVENOUS | Status: DC | PRN
  Administered 2012-12-22: 200 mg via INTRAVENOUS
  Administered 2012-12-22: 30 mg via INTRAVENOUS

## 2012-12-22 MED ORDER — LIDOCAINE HCL (CARDIAC) 20 MG/ML IV SOLN
INTRAVENOUS | Status: DC | PRN
  Administered 2012-12-22: 75 mg via INTRAVENOUS

## 2012-12-22 MED ORDER — CEFAZOLIN SODIUM-DEXTROSE 2-3 GM-% IV SOLR
INTRAVENOUS | Status: DC | PRN
  Administered 2012-12-22: 2 g via INTRAVENOUS

## 2012-12-22 MED ORDER — MIDAZOLAM HCL 5 MG/5ML IJ SOLN
INTRAMUSCULAR | Status: DC | PRN
  Administered 2012-12-22: 2 mg via INTRAVENOUS

## 2012-12-22 MED ORDER — LACTATED RINGERS IV SOLN
INTRAVENOUS | Status: DC
  Administered 2012-12-22 (×3): via INTRAVENOUS

## 2012-12-22 SURGICAL SUPPLY — 60 items
1.1X5MM DRILL BIT ×3 IMPLANT
APPLICATOR DR MATTHEWS STRL (MISCELLANEOUS) IMPLANT
BENZOIN TINCTURE PRP APPL 2/3 (GAUZE/BANDAGES/DRESSINGS) ×3 IMPLANT
CANISTER SUCTION 1200CC (MISCELLANEOUS) ×3 IMPLANT
CLOTH BEACON ORANGE TIMEOUT ST (SAFETY) ×3 IMPLANT
DECANTER SPIKE VIAL GLASS SM (MISCELLANEOUS) ×3 IMPLANT
DRSG NASOPORE 8CM (GAUZE/BANDAGES/DRESSINGS) ×6 IMPLANT
ELECT NEEDLE BLADE 2-5/6 (NEEDLE) ×3 IMPLANT
ELECT REM PT RETURN 9FT ADLT (ELECTROSURGICAL) ×3
ELECTRODE REM PT RTRN 9FT ADLT (ELECTROSURGICAL) ×2 IMPLANT
GAUZE PACKING FOLDED 2  STR (GAUZE/BANDAGES/DRESSINGS)
GAUZE PACKING FOLDED 2 STR (GAUZE/BANDAGES/DRESSINGS) IMPLANT
GAUZE VASELINE FOILPK 1/2 X 72 (GAUZE/BANDAGES/DRESSINGS) IMPLANT
GLOVE BIO SURGEON STRL SZ 6.5 (GLOVE) ×6 IMPLANT
GLOVE BIOGEL M STRL SZ7.5 (GLOVE) ×3 IMPLANT
GLOVE BIOGEL PI IND STRL 8 (GLOVE) ×2 IMPLANT
GLOVE BIOGEL PI INDICATOR 8 (GLOVE) ×1
GOWN PREVENTION PLUS XLARGE (GOWN DISPOSABLE) ×6 IMPLANT
GOWN STRL REIN 2XL LVL4 (GOWN DISPOSABLE) ×3 IMPLANT
NEEDLE 27GAX1X1/2 (NEEDLE) ×3 IMPLANT
NEEDLE HYPO 30GX1 BEV (NEEDLE) ×3 IMPLANT
NS IRRIG 1000ML POUR BTL (IV SOLUTION) ×3 IMPLANT
PACK BASIN DAY SURGERY FS (CUSTOM PROCEDURE TRAY) ×3 IMPLANT
PACK ENT DAY SURGERY (CUSTOM PROCEDURE TRAY) ×3 IMPLANT
PATTIES SURGICAL .5 X3 (DISPOSABLE) ×3 IMPLANT
PEN LACTISORB HEAT (MISCELLANEOUS) ×3 IMPLANT
PENCIL BUTTON HOLSTER BLD 10FT (ELECTRODE) ×3 IMPLANT
PLATE ORBITAL FLOOR 1.5/0.5M (Plate) ×3 IMPLANT
PLATE RGD 1.5/0.6 14-HOLE CRVD (Plate) ×3 IMPLANT
SCREW NON LOCK HT X-DR 1.5X4 (Screw) ×3 IMPLANT
SCREW NON LOCK HT X-DR 1.5X5 (Screw) ×12 IMPLANT
SHEILD EYE MED CORNL SHD 22X21 (OPHTHALMIC RELATED) ×3
SHIELD EYE MED CORNL SHD 22X21 (OPHTHALMIC RELATED) ×2 IMPLANT
SLEEVE SCD COMPRESS KNEE MED (MISCELLANEOUS) ×3 IMPLANT
SPLINT ELECTRIC BLUE LARGE (MISCELLANEOUS) ×3 IMPLANT
SPLINT ELECTRIC BLUE SMALL (MISCELLANEOUS) ×3 IMPLANT
SPLINT HOT PINK LARGE (MISCELLANEOUS) IMPLANT
SPLINT HOT PINK SMALL (MISCELLANEOUS) IMPLANT
SPLINT IVORY SMALL (MISCELLANEOUS) IMPLANT
SPLINT NASAL DOYLE BI-VL (GAUZE/BANDAGES/DRESSINGS) IMPLANT
SPLINT NASAL THERMO PLAST (MISCELLANEOUS) ×3 IMPLANT
SPLINT THERMO PLAST IVORY LRG (MISCELLANEOUS) IMPLANT
SPONGE GAUZE 2X2 8PLY STRL LF (GAUZE/BANDAGES/DRESSINGS) IMPLANT
STRIP CLOSURE SKIN 1/2X4 (GAUZE/BANDAGES/DRESSINGS) ×3 IMPLANT
SUT ETHILON 3 0 PS 1 (SUTURE) IMPLANT
SUT MON AB 5-0 P3 18 (SUTURE) IMPLANT
SUT PROLENE 6 0 P 1 18 (SUTURE) ×3 IMPLANT
SUT SILK 2 0 FS (SUTURE) ×6 IMPLANT
SUT SILK 6 0 P 1 (SUTURE) ×3 IMPLANT
SUT VIC AB 4-0 P-3 18XBRD (SUTURE) ×2 IMPLANT
SUT VIC AB 4-0 P3 18 (SUTURE) ×1
SUT VIC AB 4-0 SH 27 (SUTURE)
SUT VIC AB 4-0 SH 27XANBCTRL (SUTURE) IMPLANT
SUT VIC AB 5-0 P-3 18X BRD (SUTURE) ×2 IMPLANT
SUT VIC AB 5-0 P3 18 (SUTURE) ×1
SYR BULB 3OZ (MISCELLANEOUS) IMPLANT
TOWEL OR 17X24 6PK STRL BLUE (TOWEL DISPOSABLE) ×3 IMPLANT
TRAY DSU PREP LF (CUSTOM PROCEDURE TRAY) ×3 IMPLANT
TUBE SALEM SUMP 16 FR W/ARV (TUBING) ×3 IMPLANT
YANKAUER SUCT BULB TIP NO VENT (SUCTIONS) IMPLANT

## 2012-12-22 NOTE — Interval H&P Note (Signed)
History and Physical Interval Note:  12/22/2012 4:13 PM  Timothy Villa  has presented today for surgery, with the diagnosis of NASAL FRACTURE/RIGHT ORBITAL FRACTURE  The various methods of treatment have been discussed with the patient and family. After consideration of risks, benefits and other options for treatment, the patient has consented to  Procedure(s): CLOSED REDUCTION NASAL FRACTURE (N/A) OPEN REDUCTION INTERNAL FIXATION (ORIF) ORBITAL FRACTURE (Right) as a surgical intervention .  The patient's history has been reviewed, patient examined, no change in status, stable for surgery.  I have reviewed the patient's chart and labs.  Questions were answered to the patient's satisfaction.     SANGER,Conny Moening

## 2012-12-22 NOTE — H&P (View-Only) (Signed)
Reason for Consult:Facial fracture Referring Physician: Dr. Violeta Gelinas  ELMA LIMAS is an 47 y.o. male.  HPI: The patient is a 47 yrs old wm here with his wife for evaluation of facial fractures and right hand ulnar sided numbness.  He was cutting down a tree when a portion of it broke and he was struck in the face.  He was taken to the [facility] ED.  CT shows fracture of the left orbital floor, inferior rim, medial wall, maxillary anterior wall and bilateral displaced nasal fractures.  Clinically, he does not have a hematoma of the septum but he is bleeding from his nose.  He has significant swelling of the left periorbital area. EOMI bilaterally and no double vision.  Bone step offs of the left, no malocclusion or jaw pain.   Past Medical History  Diagnosis Date  . Fracture, orbital 12/06/2012    LEFT EYE  . Nasal fracture 12/06/2012  . Jaw fracture 12/06/2012  . GERD (gastroesophageal reflux disease)   . Back pain   . Hyperlipidemia   . Arthritis     History reviewed. No pertinent past surgical history.  History reviewed. No pertinent family history.  Social History:  reports that he has been smoking Cigarettes.  He has a 30 pack-year smoking history. He has never used smokeless tobacco. He reports that  drinks alcohol. He reports that he does not use illicit drugs.  Allergies: No Known Allergies  Medications: I have reviewed the patient's current medications.  No results found for this or any previous visit (from the past 48 hour(s)).  No results found.  Review of Systems  Constitutional: Negative.   HENT: Positive for nosebleeds.   Eyes: Positive for pain, discharge and redness. Negative for blurred vision and double vision.  Respiratory: Negative.   Cardiovascular: Negative.  Negative for chest pain.  Gastrointestinal: Negative.  Negative for nausea and vomiting.  Genitourinary: Negative.   Musculoskeletal: Negative.   Skin: Negative.   Neurological: Positive for  tingling and headaches. Negative for speech change and seizures.  Psychiatric/Behavioral: Negative.    Blood pressure 132/81, pulse 72, temperature 98.5 F (36.9 C), temperature source Axillary, resp. rate 16, SpO2 97.00%. Physical Exam  Constitutional: He appears well-developed and well-nourished.  HENT:  Head: Normocephalic.    Right Ear: External ear normal.  Left Ear: External ear normal.  Eyes: EOM are normal. Pupils are equal, round, and reactive to light. Scleral icterus is present.  Cardiovascular: Normal rate.   Respiratory: Effort normal.  Neurological: He is alert.  Skin: Skin is warm.  Psychiatric: He has a normal mood and affect. His behavior is normal. Judgment and thought content normal.    Assessment/Plan: Bilateral nasal fractures, left orbital fracture.  HOB elevated 45 degrees, no nasal blowing, afrin spray to nose. Ice on face as tolerated.  Follow up in clinic.  Will need surgery for nose and orbit.  SANGER,Asiana Benninger 12/06/2012, 6:17 PM

## 2012-12-22 NOTE — Op Note (Signed)
Operative Note   SURGICAL DIVISION: Plastic Surgery  PREOPERATIVE DIAGNOSES:  Nasal fracture closed and left orbital floor and rim fracture  POSTOPERATIVE DIAGNOSES:  same  PROCEDURE:  Closed reduction of bilateral nasal fracture with splinting, left orbital floor and rim fracture repair with open reduction internal fixation  SURGEON: [employer], DO  ASSISTANT: Shawn Rayburn, PA  ANESTHESIA:  General.   COMPLICATIONS: None.   INDICATIONS FOR PROCEDURE:  Closed nasal fracture with severe displacement.   CONSENT:  Informed consent was obtained directly from the patient. Risks, benefits and alternatives were fully discussed. Specific risks including but not limited to bleeding, infection, hematoma, seroma, scarring, pain, implant infection, implant extrusion, capsular contracture, asymmetry, wound healing problems, and need for further surgery were all discussed. The patient did have an ample opportunity to have her questions answered to her satisfaction.   DESCRIPTION OF PROCEDURE:  The patient was taken to the operating room. SCDs were placed and IV antibiotics were given. The patient's operative site was prepped and draped in a sterile fashion. A time out was performed and all information was confirmed to be correct. Lidocaine with epinephrine was in the skin under the left eye.  An eye protector was placed at the start and removed at the end of the case.  The #15 blade was used to make an incision below the ciliary line of the left eye.  The bovie was used to incise the periosteum at the level of the infraorbital rim.  The periosteal elevator was used to free the bone and locate the bone fracture.  The rim had a portion of bone ~ 5 mm that was infractured.  It was reduced and placed into the correct place.  The mini plate was used to secure the medial and lateral bone and then the fragment.  5-0 Vicryl was then used to reposition the obicularis superiorly into the lateral canthal area.   The incision was closed with 6-0 Prolene.  Afrin packing was placed in the nose and lidocaine 1% with epinepherine was injected at the level of the bone on each side of the nasal bones.  After waiting several minutes for the epinepherine to take effect the straight instrument was used to reduce the fractures.  The left side was outfractured and the right side was infractured.  There was a large bone spur in the right nostril that was removed with the bone instrument.  The mucosa was opened with a #15 blade and the bone was removed.  A 5-0 vicryl was used to reposition the mucosa. The nostrils were packed with sponge nasal packing and steri strips were placed on the nose. The nose was then splinted with a zomig splint.  The patient was allowed to wake from anesthesia, extubated and taken to the recovery room in satisfactory condition. There were no complication and the patient tolerated the procedure well.

## 2012-12-22 NOTE — Brief Op Note (Signed)
12/22/2012  6:20 PM  PATIENT:  Philis Pique  47 y.o. male  PRE-OPERATIVE DIAGNOSIS:  NASAL FRACTURE/left ORBITAL FRACTURE  POST-OPERATIVE DIAGNOSIS:  NASAL FRACTURE/LEFT ORBITAL FRACTURE  PROCEDURE:  Procedure(s): CLOSED REDUCTION NASAL FRACTURE (N/A) OPEN REDUCTION INTERNAL FIXATION (ORIF) ORBITAL FRACTURE (Left)  SURGEON:  Surgeon(s) and Role:    * Claire Sanger, DO - Primary  PHYSICIAN ASSISTANT: Shawn Rayburn, PA  ASSISTANTS: Shawn Rayburn, PA   ANESTHESIA:   general  EBL:  Total I/O In: 2000 [I.V.:2000] Out: -   BLOOD ADMINISTERED:none  DRAINS: none   LOCAL MEDICATIONS USED:  LIDOCAINE   SPECIMEN:  No Specimen  DISPOSITION OF SPECIMEN:  N/A  COUNTS:  YES  TOURNIQUET:  * No tourniquets in log *  DICTATION: .Dragon Dictation  PLAN OF CARE: Discharge to home after PACU  PATIENT DISPOSITION:  PACU - hemodynamically stable.   Delay start of Pharmacological VTE agent (>24hrs) due to surgical blood loss or risk of bleeding: no

## 2012-12-22 NOTE — Anesthesia Postprocedure Evaluation (Signed)
Anesthesia Post Note  Patient: Timothy Villa  Procedure(s) Performed: Procedure(s) (LRB): CLOSED REDUCTION NASAL FRACTURE (N/A) OPEN REDUCTION INTERNAL FIXATION (ORIF) ORBITAL FRACTURE (Left)  Anesthesia type: general  Patient location: PACU  Post pain: Pain level controlled  Post assessment: Patient's Cardiovascular Status Stable  Last Vitals:  Filed Vitals:   12/22/12 1915  BP: 149/98  Pulse: 65  Temp:   Resp: 11    Post vital signs: Reviewed and stable  Level of consciousness: sedated  Complications: No apparent anesthesia complications

## 2012-12-22 NOTE — Anesthesia Preprocedure Evaluation (Addendum)
Anesthesia Evaluation  Patient identified by MRN, date of birth, ID band Patient awake    Reviewed: Allergy & Precautions, H&P , NPO status , Patient's Chart, lab work & pertinent test results  History of Anesthesia Complications Negative for: history of anesthetic complications  Airway Mallampati: II  Neck ROM: Full    Dental  (+) Caps, Loose and Dental Advisory Given,    Pulmonary neg pulmonary ROS,  breath sounds clear to auscultation        Cardiovascular negative cardio ROS  Rhythm:Regular Rate:Normal     Neuro/Psych negative neurological ROS     GI/Hepatic GERD-  ,  Endo/Other    Renal/GU      Musculoskeletal   Abdominal   Peds  Hematology   Anesthesia Other Findings   Reproductive/Obstetrics                          Anesthesia Physical Anesthesia Plan  ASA: II  Anesthesia Plan: General   Post-op Pain Management:    Induction: Intravenous  Airway Management Planned: Oral ETT  Additional Equipment:   Intra-op Plan:   Post-operative Plan: Extubation in OR  Informed Consent: I have reviewed the patients History and Physical, chart, labs and discussed the procedure including the risks, benefits and alternatives for the proposed anesthesia with the patient or authorized representative who has indicated his/her understanding and acceptance.   Dental advisory given  Plan Discussed with: CRNA and Surgeon  Anesthesia Plan Comments:        Anesthesia Quick Evaluation

## 2012-12-22 NOTE — Transfer of Care (Signed)
Immediate Anesthesia Transfer of Care Note  Patient: Timothy Villa  Procedure(s) Performed: Procedure(s): CLOSED REDUCTION NASAL FRACTURE (N/A) OPEN REDUCTION INTERNAL FIXATION (ORIF) ORBITAL FRACTURE (Left)  Patient Location: PACU  Anesthesia Type:General  Level of Consciousness: awake, sedated and patient cooperative  Airway & Oxygen Therapy: Patient Spontanous Breathing and aerosol face mask  Post-op Assessment: Report given to PACU RN and Post -op Vital signs reviewed and stable  Post vital signs: Reviewed and stable  Complications: No apparent anesthesia complications

## 2012-12-30 ENCOUNTER — Encounter (HOSPITAL_BASED_OUTPATIENT_CLINIC_OR_DEPARTMENT_OTHER): Payer: Self-pay | Admitting: Plastic Surgery

## 2013-01-11 ENCOUNTER — Ambulatory Visit (HOSPITAL_BASED_OUTPATIENT_CLINIC_OR_DEPARTMENT_OTHER): Admit: 2013-01-11 | Payer: Self-pay | Admitting: Plastic Surgery

## 2013-01-11 ENCOUNTER — Encounter (HOSPITAL_BASED_OUTPATIENT_CLINIC_OR_DEPARTMENT_OTHER): Payer: Self-pay

## 2013-01-11 SURGERY — CLOSED REDUCTION, FRACTURE, NASAL BONE
Anesthesia: General | Laterality: Right

## 2014-12-23 ENCOUNTER — Emergency Department (HOSPITAL_COMMUNITY)
Admission: EM | Admit: 2014-12-23 | Discharge: 2014-12-23 | Disposition: A | Discharge status: Home / Self Care | Payer: BLUE CROSS/BLUE SHIELD | Attending: Emergency Medicine | Admitting: Emergency Medicine

## 2014-12-23 ENCOUNTER — Emergency Department (HOSPITAL_COMMUNITY): Payer: BLUE CROSS/BLUE SHIELD

## 2014-12-23 ENCOUNTER — Encounter (HOSPITAL_COMMUNITY): Payer: Self-pay | Admitting: Emergency Medicine

## 2014-12-23 DIAGNOSIS — Z791 Long term (current) use of non-steroidal anti-inflammatories (NSAID): Secondary | ICD-10-CM | POA: Diagnosis not present

## 2014-12-23 DIAGNOSIS — Z79899 Other long term (current) drug therapy: Secondary | ICD-10-CM | POA: Diagnosis not present

## 2014-12-23 DIAGNOSIS — M546 Pain in thoracic spine: Secondary | ICD-10-CM | POA: Diagnosis not present

## 2014-12-23 DIAGNOSIS — E785 Hyperlipidemia, unspecified: Secondary | ICD-10-CM | POA: Insufficient documentation

## 2014-12-23 DIAGNOSIS — Z8781 Personal history of (healed) traumatic fracture: Secondary | ICD-10-CM | POA: Diagnosis not present

## 2014-12-23 DIAGNOSIS — K219 Gastro-esophageal reflux disease without esophagitis: Secondary | ICD-10-CM | POA: Insufficient documentation

## 2014-12-23 DIAGNOSIS — M1990 Unspecified osteoarthritis, unspecified site: Secondary | ICD-10-CM | POA: Diagnosis not present

## 2014-12-23 DIAGNOSIS — Z720 Tobacco use: Secondary | ICD-10-CM | POA: Diagnosis not present

## 2014-12-23 DIAGNOSIS — M549 Dorsalgia, unspecified: Secondary | ICD-10-CM | POA: Diagnosis present

## 2014-12-23 MED ORDER — HYDROMORPHONE HCL 1 MG/ML IJ SOLN
1.0000 mg | Freq: Once | INTRAMUSCULAR | Status: AC
  Administered 2014-12-23: 1 mg via INTRAMUSCULAR
  Filled 2014-12-23: qty 1

## 2014-12-23 MED ORDER — DIAZEPAM 5 MG PO TABS
10.0000 mg | ORAL_TABLET | Freq: Once | ORAL | Status: AC
  Administered 2014-12-23: 10 mg via ORAL
  Filled 2014-12-23: qty 2

## 2014-12-23 MED ORDER — DIAZEPAM 5 MG PO TABS: 5.0000 mg | ORAL_TABLET | Freq: Four times a day (QID) | ORAL | Status: AC | PRN

## 2014-12-23 NOTE — Discharge Instructions (Signed)
Plain x-rays showed no fractures. Follow-up with neurosurgery. Phone number given. Prescription for muscle relaxer.

## 2014-12-23 NOTE — ED Notes (Signed)
Pt verbalized understanding of no driving and to use caution within 4 hours of taking pain meds due to meds cause drowsiness 

## 2014-12-23 NOTE — ED Notes (Signed)
Pt states that he sneezed and started having back pain.

## 2014-12-23 NOTE — ED Provider Notes (Signed)
CSN: [id number]     Arrival date & time 12/23/14  1719 History   First MD Initiated Contact with Patient 12/23/14 1727     Chief Complaint  Patient presents with  . Back Pain     (Consider location/radiation/quality/duration/timing/severity/associated sxs/prior Treatment) HPI..... Patient complains of spasm in his mid upper chest after sneezing tonight. No anterior chest pain, dyspnea, other complaints. He is currently under the care of a pain management group. He did not have any strong pain medicine take at home. He initially requested an MRI scan for his back.  Past Medical History  Diagnosis Date  . Fracture, orbital 12/06/2012    LEFT EYE  . Nasal fracture 12/06/2012  . Jaw fracture 12/06/2012  . GERD (gastroesophageal reflux disease)   . Back pain   . Hyperlipidemia   . Arthritis    Past Surgical History  Procedure Laterality Date  . No past surgeries    . Closed reduction nasal fracture N/A 12/22/2012    Procedure: CLOSED REDUCTION NASAL FRACTURE;  Surgeon: Wayland Denislaire Sanger, DO;  Location: [facility];  Service: Plastics;  Laterality: N/A;  . Orif orbital fracture Left 12/22/2012    Procedure: OPEN REDUCTION INTERNAL FIXATION (ORIF) ORBITAL FRACTURE;  Surgeon: Wayland Denislaire Sanger, DO;  Location: [facility];  Service: Plastics;  Laterality: Left;   History reviewed. No pertinent family history. History  Substance Use Topics  . Smoking status: Current Every Day Smoker -- 1.00 packs/day for 30 years    Types: Cigarettes  . Smokeless tobacco: Never Used  . Alcohol Use: Yes     Comment: SOCIAL    Review of Systems  All other systems reviewed and are negative.     Allergies  Review of patient's allergies indicates no known allergies.  Home Medications   Prior to Admission medications   Medication Sig Start Date End Date Taking? Authorizing Provider  cyclobenzaprine (FLEXERIL) 5 MG tablet Take 5 mg by mouth 3 (three) times daily as needed for  muscle spasms.   Yes Historical Provider, MD  omeprazole (PRILOSEC) 20 MG capsule Take 20 mg by mouth daily.   Yes Historical Provider, MD  simvastatin (ZOCOR) 40 MG tablet Take 40 mg by mouth daily.    Yes Historical Provider, MD  diazepam (VALIUM) 5 MG tablet Take 1 tablet (5 mg total) by mouth every 6 (six) hours as needed for anxiety or muscle spasms. 12/23/14   Donnetta HutchingBrian Cameo Shewell, MD  naproxen (NAPROSYN) 500 MG tablet Take 1 tablet (500 mg total) by mouth 2 (two) times daily with a meal. 12/07/12   Freeman CaldronMichael J Jeffery, PA-C  oxymetazoline (AFRIN) 0.05 % nasal spray Place 2 sprays into the nose 2 (two) times daily. 12/07/12   Freeman CaldronMichael J Jeffery, PA-C  traMADol (ULTRAM) 50 MG tablet Take 2 tablets (100 mg total) by mouth every 6 (six) hours. 12/07/12   Freeman CaldronMichael J Jeffery, PA-C   BP 120/98 mmHg  Pulse 77  Temp(Src) 99 F (37.2 C) (Oral)  Resp 18  Ht 6\' 2"  (1.88 m)  Wt 194 lb 8 oz (88.225 kg)  BMI 24.96 kg/m2  SpO2 99% Physical Exam  Constitutional: He is oriented to person, place, and time. He appears well-developed and well-nourished.  HENT:  Head: Normocephalic and atraumatic.  Eyes: Conjunctivae and EOM are normal. Pupils are equal, round, and reactive to light.  Neck: Normal range of motion. Neck supple.  Cardiovascular: Normal rate and regular rhythm.   Pulmonary/Chest: Effort normal and breath sounds normal.  Abdominal: Soft. Bowel sounds are normal.  Musculoskeletal:  Minimal paraspinous tenderness approximately T67  Neurological: He is alert and oriented to person, place, and time.  Skin: Skin is warm and dry.  Psychiatric: He has a normal mood and affect. His behavior is normal.  Nursing note and vitals reviewed.   ED Course  Procedures (including critical care time) Labs Review Labs Reviewed - No data to display  Imaging Review No results found.   EKG Interpretation None      MDM   Final diagnoses:  Midline thoracic back pain   Patient felt much better after pain  management. Thoracic films were negative. I visited the patient's room on 4 different occasions to answer questions. Patient's wife thought I was rude and disrespectful. However, at no time did I raise my voice or act disrespectfully toward the patient or his wife. I attempted to answer questions and explained what I thought was happening.     Donnetta Hutching, MD 12/25/14 2238

## 2015-02-05 ENCOUNTER — Ambulatory Visit: Payer: BLUE CROSS/BLUE SHIELD | Admitting: Physical Therapy

## 2015-02-07 DEATH — deceased

## 2016-05-01 IMAGING — DX DG THORACIC SPINE 3V
3 series · 3 of 3 positions shown · non-contrast
Comparison: None.

CLINICAL DATA: Sneezed 3 hours ago and had severe mid thoracic back
pain.

EXAM:
THORACIC SPINE - 2 VIEW + SWIMMERS

[t-spine ap]
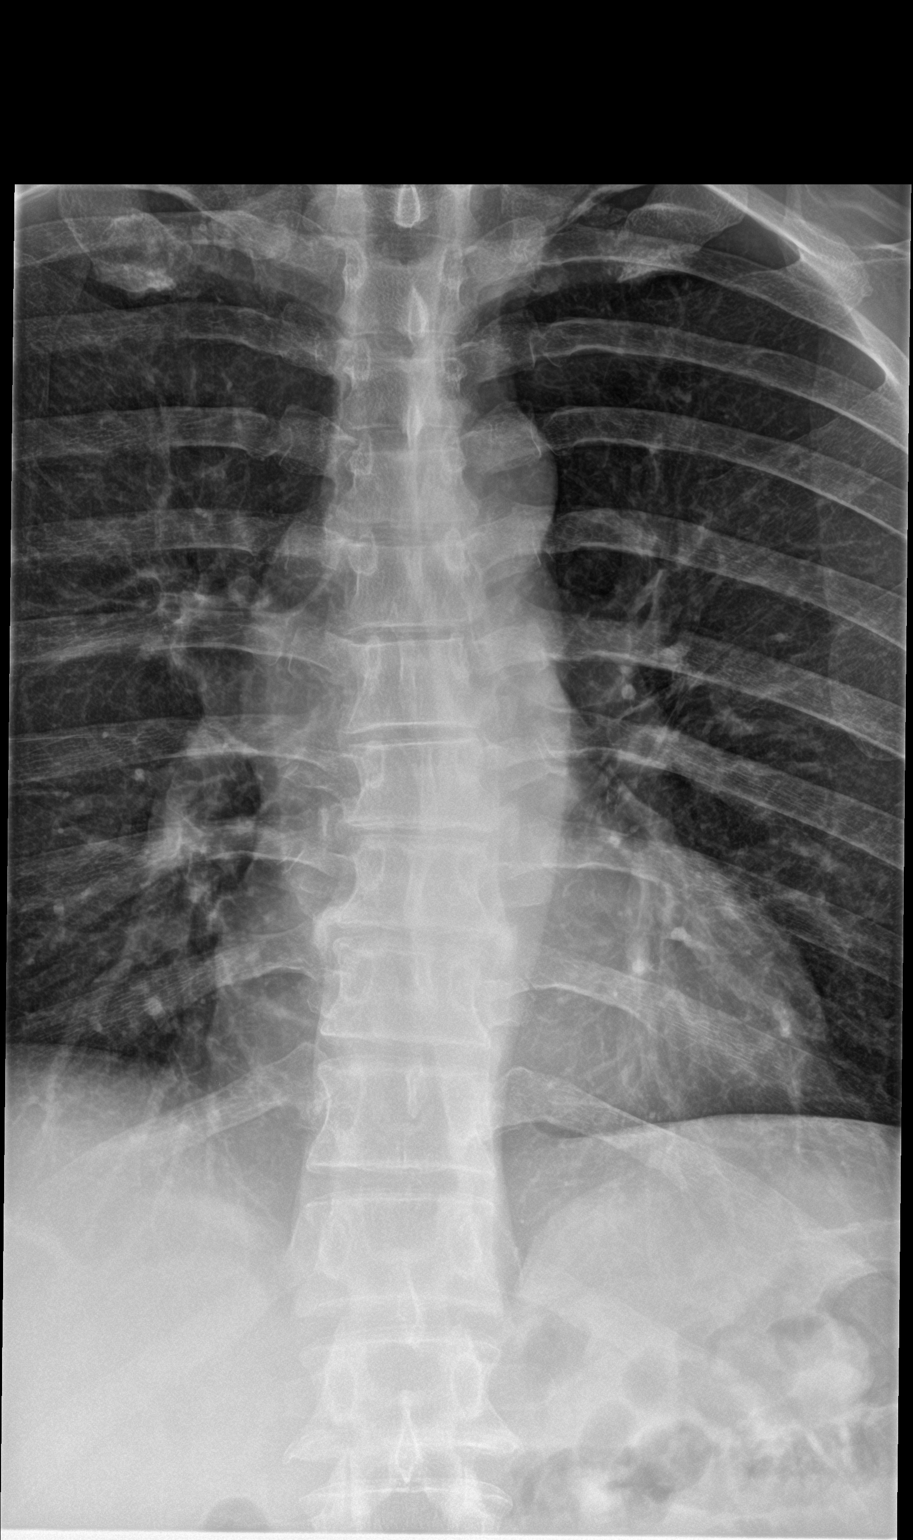

[t-spine lat]
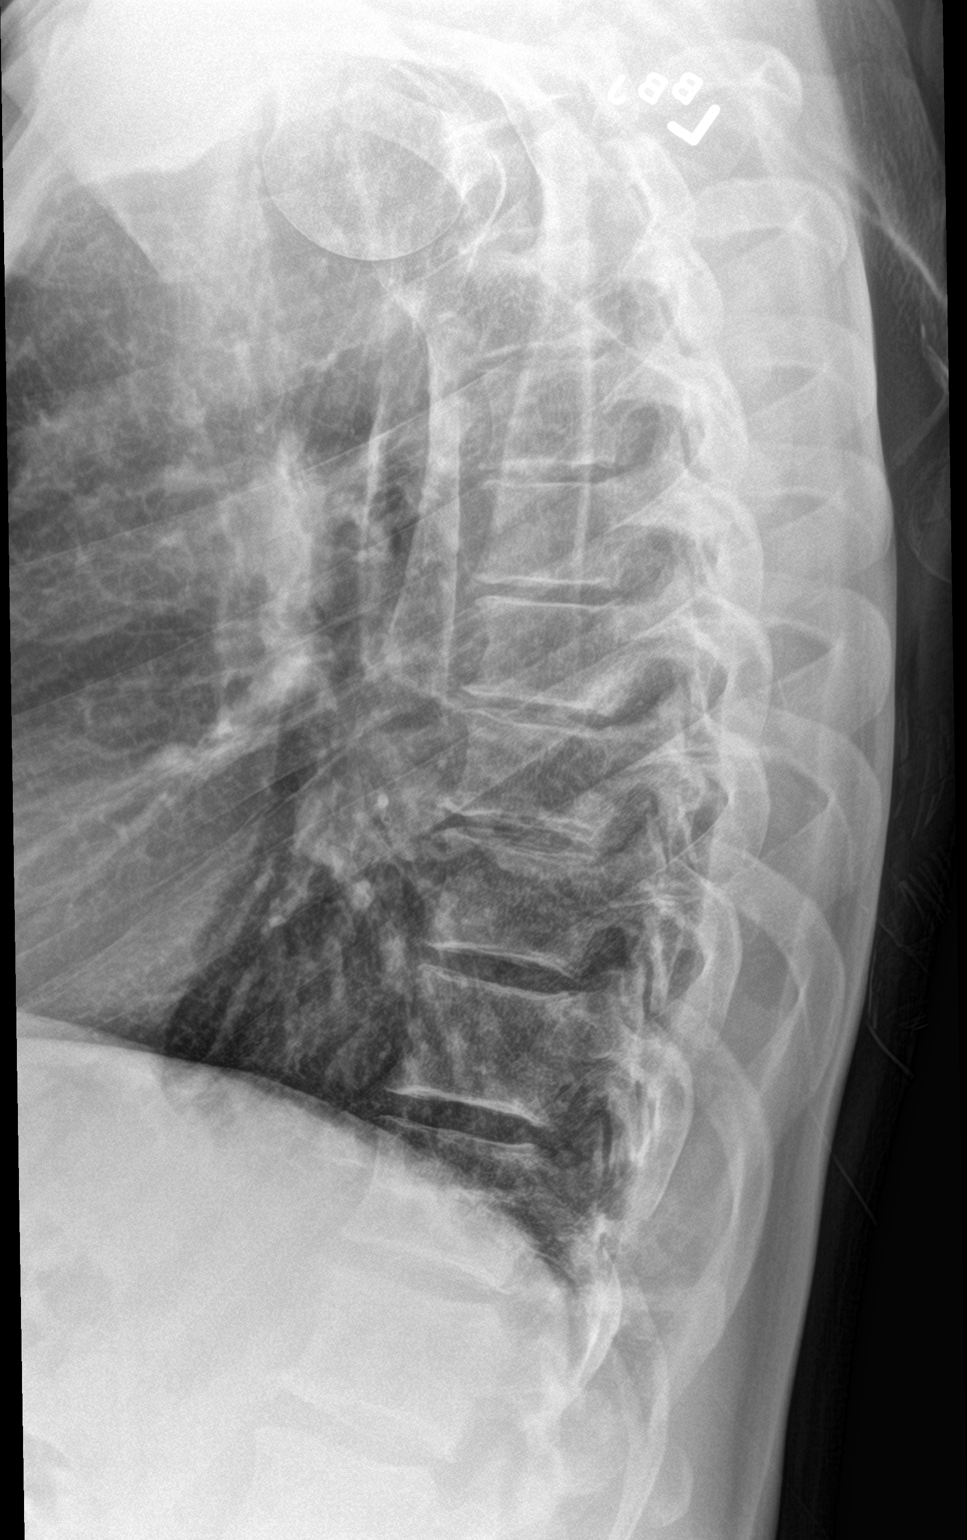

[t-spine swimmers]
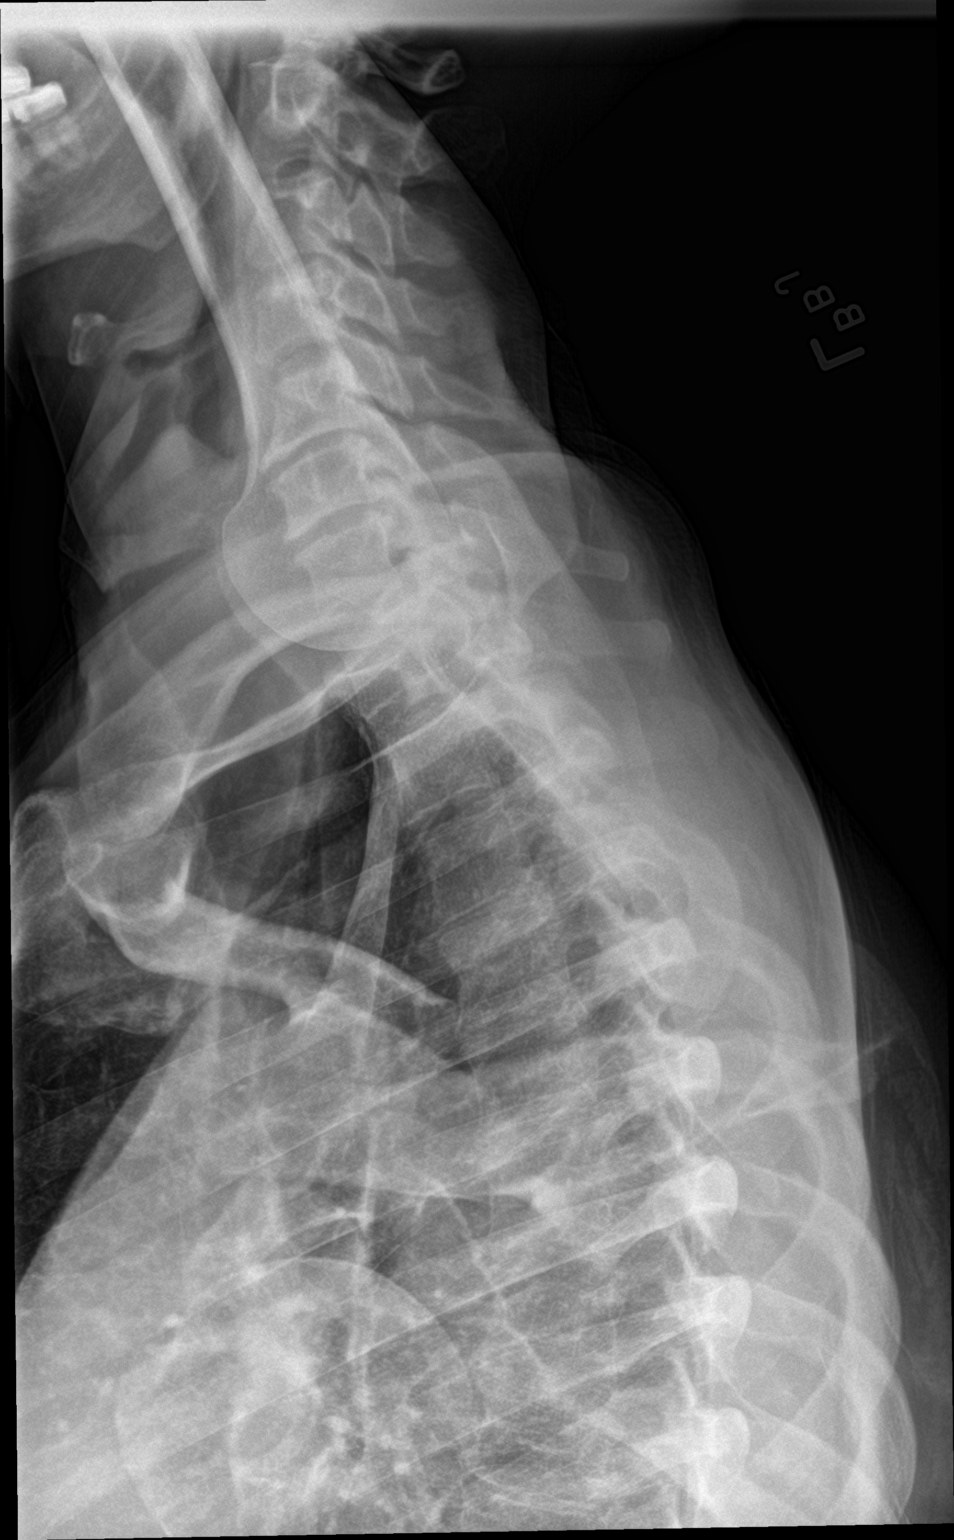

[3 of 3 positions shown; findings below may reference images not displayed]

FINDINGS: There is no evidence of thoracic spine fracture. Alignment is
normal. No other significant bone abnormalities are identified.
Degenerative disc disease at C5-6.
IMPRESSION: No acute osseous injury of the thoracic spine.
